# Patient Record
Sex: Male | Born: 1990 | Race: White | Hispanic: No | Marital: Single | Smoking: Never smoker
Health system: Southern US, Community
[De-identification: ages and names within clinical notes are randomized; demographics above are authoritative.]

---

## 2013-11-28 ENCOUNTER — Emergency Department (HOSPITAL_COMMUNITY)
Admission: EM | Admit: 2013-11-28 | Discharge: 2013-11-28 | Disposition: A | Discharge status: Home / Self Care | Payer: BC Managed Care – PPO | Source: Home / Self Care | Attending: Emergency Medicine | Admitting: Emergency Medicine

## 2013-11-28 ENCOUNTER — Emergency Department (INDEPENDENT_AMBULATORY_CARE_PROVIDER_SITE_OTHER): Payer: BC Managed Care – PPO

## 2013-11-28 ENCOUNTER — Encounter (HOSPITAL_COMMUNITY): Payer: Self-pay | Admitting: Emergency Medicine

## 2013-11-28 DIAGNOSIS — S93409A Sprain of unspecified ligament of unspecified ankle, initial encounter: Secondary | ICD-10-CM

## 2013-11-28 DIAGNOSIS — X500XXA Overexertion from strenuous movement or load, initial encounter: Secondary | ICD-10-CM

## 2013-11-28 NOTE — ED Provider Notes (Signed)
Chief Complaint   Chief Complaint  Patient presents with  . Ankle Pain    History of Present Illness   Christopher Franco is a 23 year old male who rolled his ankle twice in 2 days. The first time it didn't bother him much, but the second time it was more painful. The second occurrence happened 2 days ago. He's had pain beneath the lateral malleolus since then and possibly a little bit of swelling and bruising. He's able to bear weight and to move the ankle with minimal pain. There is no numbness or tingling in the foot.  Review of Systems   Other than as noted above, the patient denies any of the following symptoms: Systemic:  No fevers or chills.   Musculoskeletal:  No joint pain or swelling, back pain, or neck pain. Neurological:  No muscular weakness or paresthesias.  PMFSH   Past medical history, family history, social history, meds, and allergies were reviewed.   Physical Examination     Vital signs:  BP 142/97  Pulse 65  Temp(Src) 98.7 F (37.1 C) (Oral)  Resp 16  SpO2 98% Gen:  Alert and oriented times 3.  In no distress. Musculoskeletal: Exam of the ankle reveals slight swelling and tenderness to palpation just beneath and anterior to the lateral malleolus. There is some discoloration in that area. There is no pain over the malleolus itself or posterior to the malleolus. The ankle joint has a full range of motion with some pain at the endpoints of movement. Anterior drawer sign negative negative.  Talar tilt negative. Squeeze test negative. Achilles tendon, peroneal tendon, and tibialis posterior were intact. Otherwise, all joints had a full a ROM with no swelling, bruising or deformity.  No edema, pulses full. Extremities were warm and pink.  Capillary refill was brisk.  Skin:  Clear, warm and dry.  No rash. Neuro:  Alert and oriented times 3.  Muscle strength was normal.  Sensation was intact to light touch.   Radiology   Dg Ankle Complete Right  11/28/2013   CLINICAL DATA:   Inversion injury 2 days ago now with posterior lateral malleolar discomfort  EXAM: RIGHT ANKLE - COMPLETE 3+ VIEW  COMPARISON:  None.  FINDINGS: The ankle joint mortise is preserved. The tailor dome is intact. There is no acute malleolar fracture. There is soft tissue swelling over the lateral malleolus. The talus and calcaneus are intact.  IMPRESSION: There is no acute bony abnormality of the right ankle. Swelling over the lateral malleolus is consistent with underlying soft tissue injury.   Electronically Signed   By: Adelaido Nicklaus  [place]   On: 11/28/2013 12:48   I reviewed the images independently and personally and concur with the radiologist's findings.  Course in [facility]   The patient was placed in an ASO brace.  Assessment   The encounter diagnosis was Ankle sprain.  Plan     1.  Meds:  The following meds were prescribed:  There are no discharge medications for this patient.   2.  Patient Education/Counseling:  The patient was given appropriate handouts, self care instructions, including rest and activity, elevation, application of ice and compression, and instructed in symptomatic relief.    3.  Follow up:  The patient was told to follow up here if no better in 3 to 4 days, or sooner if becoming worse in any way, and given some red flag symptoms such as increasing pain or neurological symptoms which would prompt immediate return.  Follow up  with Dr. Aldean BakerMarcus Duda is no better in 2 weeks.     Reuben Likesavid C Bishoy Cupp, MD 11/28/13 [phone]

## 2013-11-28 NOTE — ED Notes (Signed)
C/o right ankle pain due to rolling ankle twice [facility] and rolled ankle on Monday and then again on Tuesday States he is swelling and tender to touch

## 2013-11-28 NOTE — Discharge Instructions (Signed)

## 2014-12-07 ENCOUNTER — Emergency Department (INDEPENDENT_AMBULATORY_CARE_PROVIDER_SITE_OTHER)
Admission: EM | Admit: 2014-12-07 | Discharge: 2014-12-07 | Disposition: A | Discharge status: Home / Self Care | Payer: BLUE CROSS/BLUE SHIELD | Source: Home / Self Care | Attending: Family Medicine | Admitting: Family Medicine

## 2014-12-07 ENCOUNTER — Encounter (HOSPITAL_COMMUNITY): Payer: Self-pay | Admitting: Family Medicine

## 2014-12-07 DIAGNOSIS — I10 Essential (primary) hypertension: Secondary | ICD-10-CM

## 2014-12-07 DIAGNOSIS — S61219A Laceration without foreign body of unspecified finger without damage to nail, initial encounter: Secondary | ICD-10-CM

## 2014-12-07 NOTE — ED Provider Notes (Addendum)
CSN: [id number]     Arrival date & time 12/07/14  1703 History   First MD Initiated Contact with Patient 12/07/14 1718     Chief Complaint  Patient presents with  . Laceration   (Consider location/radiation/quality/duration/timing/severity/associated sxs/prior Treatment) HPI  Left middle finger laceration. Patient was preparing food and actually cut his finger with a sharp kitchen knife. Occurred at 1630. Tetanus is up-to-date. Her finger started to bleed immediately but applied pressure with resolution of the bleeding. No loss of consciousness. Problem is constant and unrelieved.   History reviewed. No pertinent past medical history. History reviewed. No pertinent past surgical history. Family History  Problem Relation Age of Onset  . Diabetes Father    History  Substance Use Topics  . Smoking status: Never Smoker   . Smokeless tobacco: Not on file  . Alcohol Use: No    Review of Systems Per HPI with all other pertinent systems negative.    Allergies  Review of patient's allergies indicates no known allergies.  Home Medications   Prior to Admission medications   Not on File   BP 161/90 mmHg  Pulse 114  Temp(Src) 99.2 F (37.3 C) (Oral)  Resp 18  SpO2 95% Physical Exam Physical Exam  Constitutional: oriented to person, place, and time. appears well-developed and well-nourished. No distress.  HENT:  Head: Normocephalic and atraumatic.  Eyes: EOMI. PERRL.  Neck: Normal range of motion.  Cardiovascular: RRR, no m/r/g, 2+ distal pulses,  Pulmonary/Chest: Effort normal and breath sounds normal. No respiratory distress.  Abdominal: Soft. Bowel sounds are normal. NonTTP, no distension.  Musculoskeletal: Normal range of motion. Non ttp, no effusion.  Neurological: alert and oriented to person, place, and time.  Skin: Skin is warm. No rash noted. non diaphoretic.  Psychiatric: normal mood and affect. behavior is normal. Judgment and thought content normal.   ED Course   LACERATION REPAIR Date/Time: 12/07/2014 5:46 PM Performed by: Konrad Dolores, Catarina Huntley J Authorized by: Konrad Dolores, Morayma Godown J Consent: Verbal consent obtained. Risks and benefits: risks, benefits and alternatives were discussed Consent given by: patient Patient identity confirmed: verbally with patient Location: L middle finger. Tendon involvement: none Nerve involvement: superficial Anesthesia: digital block and local infiltration Local anesthetic: lidocaine 2% without epinephrine Anesthetic total: 5 ml Preparation: Patient was prepped and draped in the usual sterile fashion. Irrigation solution: saline (Betadine) Amount of cleaning: standard Debridement: none Skin closure: 4-0 Prolene Number of sutures: 4 Technique: simple Approximation: close Approximation difficulty: simple Dressing: antibiotic ointment Patient tolerance: Patient tolerated the procedure well with no immediate complications   (including critical care time) Labs Review Labs Reviewed - No data to display  Imaging Review No results found.   MDM   1. Finger laceration, initial encounter   2. Essential hypertension    Laceration repair as above. Anticipate very central part of laceration to necrosis and slough off as patient's laceration was circumferential with a small pedunculated area of attachment and patient significantly devascularize the central portion of his laceration. Elevated blood pressure and tachycardia likely due to stress. Patient follow-up with PCP regarding elevated blood pressure remains elevated.    Ozella Rocks, MD 12/07/14 6734  Ozella Rocks, MD 12/07/14 [phone]

## 2014-12-07 NOTE — ED Notes (Signed)
Pt states that he sliced his finger on his left hand

## 2014-12-07 NOTE — Discharge Instructions (Signed)
Because your finger was prepared in our clinic. He still may lose the very tip of the finger but the area should heal well. Please apply antibiotic ointment until the sutures are removed. Please return for suture removal in 10 days. Please return sooner if there are signs of infection.

## 2015-02-22 IMAGING — CR DG ANKLE COMPLETE 3+V*R*
3 series · 3 of 3 positions shown · non-contrast
Comparison: None.

CLINICAL DATA: Inversion injury 2 days ago now with posterior
lateral malleolar discomfort

EXAM:
RIGHT ANKLE - COMPLETE 3+ VIEW

[view not recorded (1 of 3)]
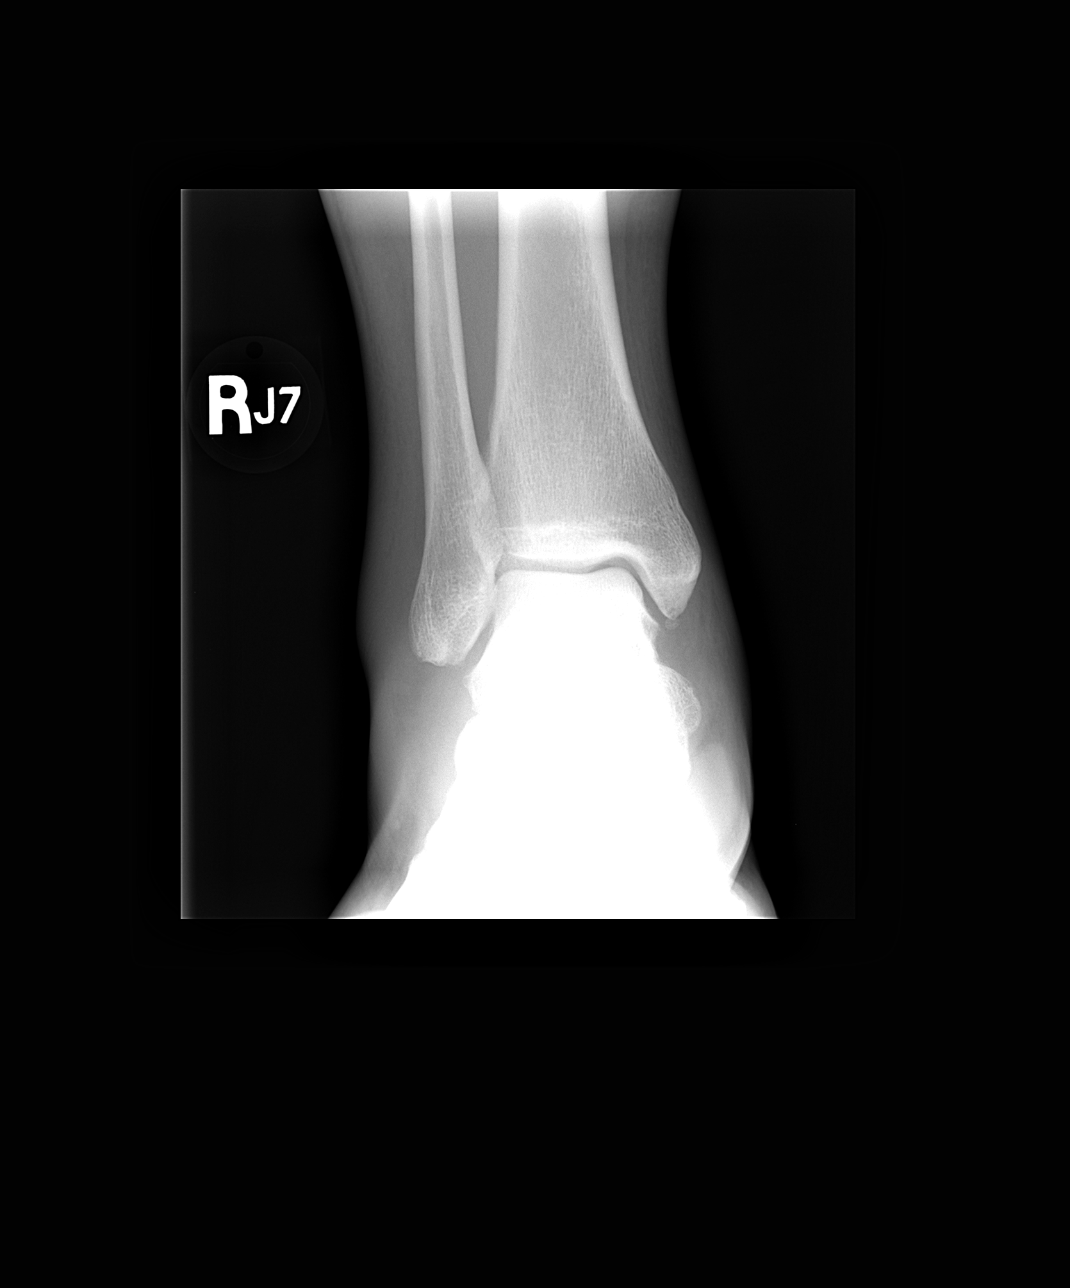

[view not recorded (2 of 3)]
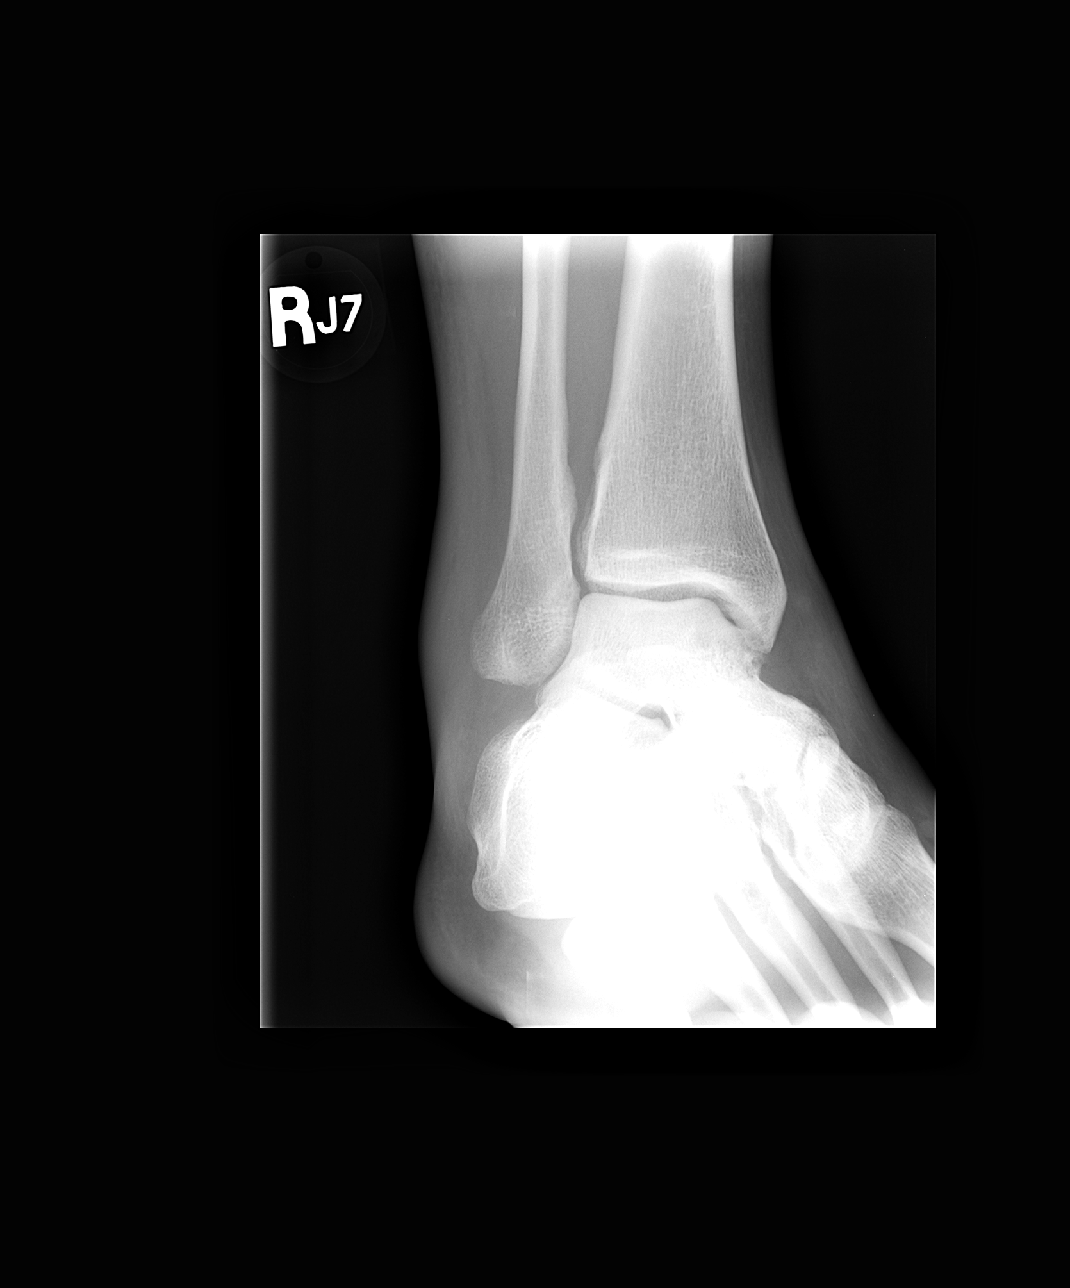

[view not recorded (3 of 3)]
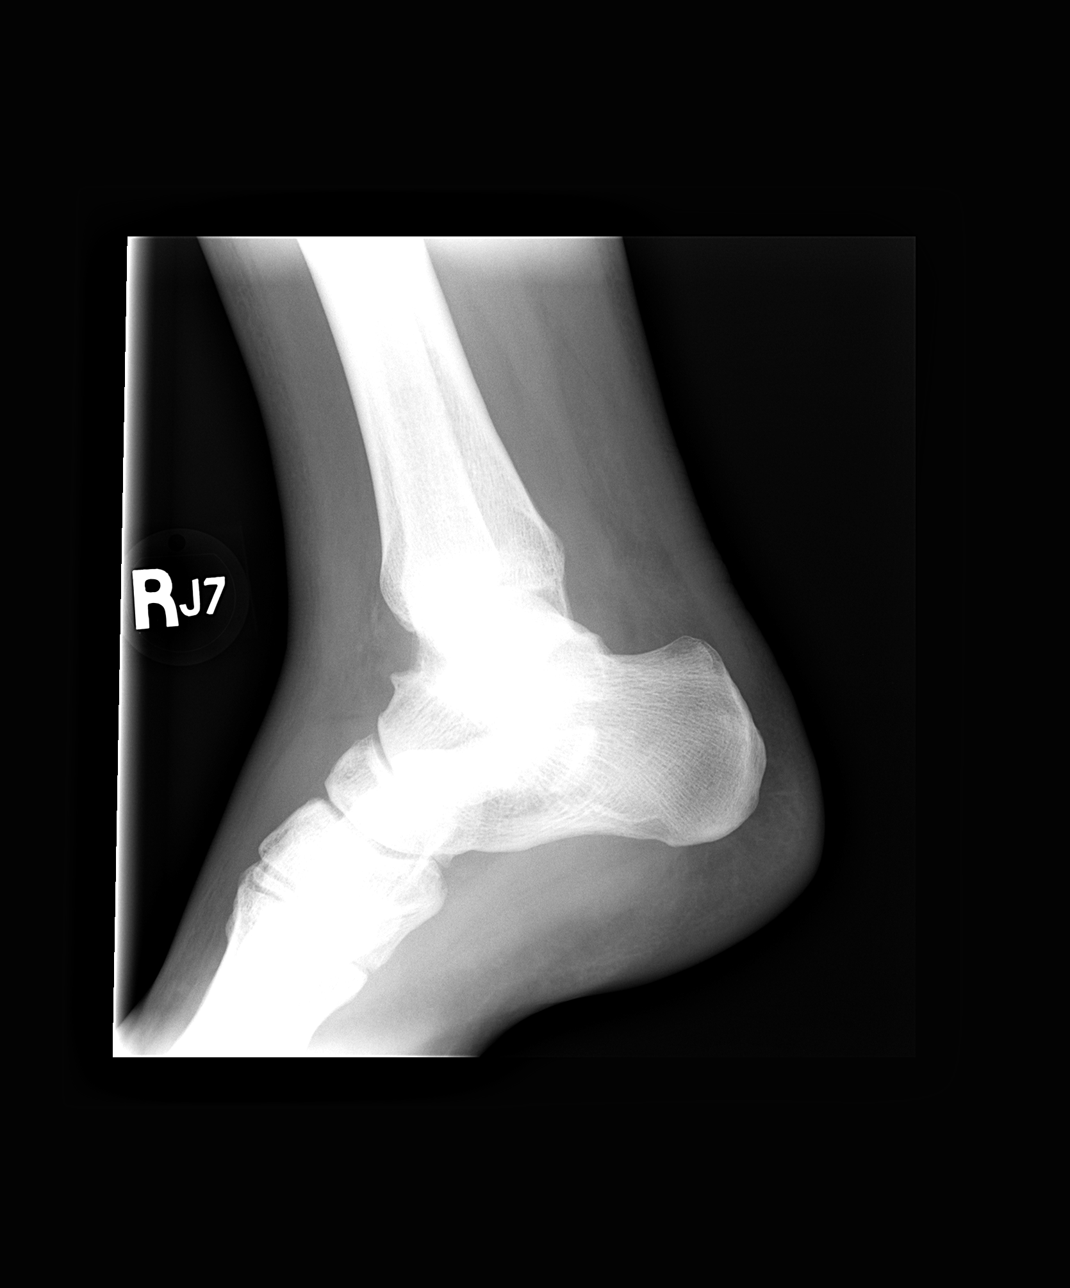

[3 of 3 positions shown; findings below may reference images not displayed]

FINDINGS: The ankle joint mortise is preserved. The tailor dome is intact.
There is no acute malleolar fracture. There is soft tissue swelling
over the lateral malleolus. The talus and calcaneus are intact.
IMPRESSION: There is no acute bony abnormality of the right ankle. Swelling over
the lateral malleolus is consistent with underlying soft tissue
injury.
